# Patient Record
Sex: Female | Born: 1965
Health system: Southern US, Community
[De-identification: ages and names within clinical notes are randomized; demographics above are authoritative.]

## PROBLEM LIST (undated history)

## (undated) DIAGNOSIS — I10 Essential (primary) hypertension: Secondary | ICD-10-CM

## (undated) HISTORY — PX: TUBAL LIGATION: SHX77

---

## 1999-07-11 ENCOUNTER — Other Ambulatory Visit: Admission: RE | Admit: 1999-07-11 | Discharge: 1999-07-11 | Payer: Self-pay | Admitting: Obstetrics and Gynecology

## 2000-01-16 ENCOUNTER — Inpatient Hospital Stay (HOSPITAL_COMMUNITY): Admission: AD | Admit: 2000-01-16 | Discharge: 2000-01-20 | Payer: Self-pay | Admitting: Obstetrics and Gynecology

## 2000-01-16 ENCOUNTER — Encounter (INDEPENDENT_AMBULATORY_CARE_PROVIDER_SITE_OTHER): Payer: Self-pay

## 2000-03-26 ENCOUNTER — Other Ambulatory Visit: Admission: RE | Admit: 2000-03-26 | Discharge: 2000-03-26 | Payer: Self-pay | Admitting: Obstetrics and Gynecology

## 2001-04-20 ENCOUNTER — Other Ambulatory Visit: Admission: RE | Admit: 2001-04-20 | Discharge: 2001-04-20 | Payer: Self-pay | Admitting: Obstetrics and Gynecology

## 2002-05-28 ENCOUNTER — Other Ambulatory Visit: Admission: RE | Admit: 2002-05-28 | Discharge: 2002-05-28 | Payer: Self-pay | Admitting: Obstetrics and Gynecology

## 2003-06-08 ENCOUNTER — Other Ambulatory Visit: Admission: RE | Admit: 2003-06-08 | Discharge: 2003-06-08 | Payer: Self-pay | Admitting: Obstetrics and Gynecology

## 2004-07-06 ENCOUNTER — Other Ambulatory Visit: Admission: RE | Admit: 2004-07-06 | Discharge: 2004-07-06 | Payer: Self-pay | Admitting: Obstetrics and Gynecology

## 2005-07-29 ENCOUNTER — Other Ambulatory Visit: Admission: RE | Admit: 2005-07-29 | Discharge: 2005-07-29 | Payer: Self-pay | Admitting: Obstetrics and Gynecology

## 2007-03-06 ENCOUNTER — Ambulatory Visit (HOSPITAL_COMMUNITY): Admission: RE | Admit: 2007-03-06 | Discharge: 2007-03-06 | Payer: Self-pay | Admitting: Obstetrics and Gynecology

## 2011-02-01 NOTE — Discharge Summary (Signed)
[facility]  Patient:    Denise Trujillo, Denise Trujillo                    MRN: [id number] Adm. Date:  [id number] Disc. Date: [id number] Attending:  Cordelia Pen Ii Dictator:   Danie Chandler, R.N.                           Discharge Summary  ADMISSION DIAGNOSES:          1. Intrauterine pregnancy at 39-3/[redacted] weeks gestation.                               2. Previous cesarean section desires repeat.                               3. Spontaneous rupture of membranes.  DISCHARGE DIAGNOSES:          1. Intrauterine pregnancy at 39-3/[redacted] weeks gestation.                               2. Previous cesarean section desires repeat.                               3. Spontaneous rupture of membranes.  PROCEDURE:                    On Jan 16, 2000, repeat low transverse cesarean section.  REASON FOR ADMISSION:         The patient is a 45 year old black female, gravida 2, para 1, with a previous cesarean section who desires repeat.  The patient had spontaneous rupture of membranes on the morning of Jan 16, 2000, and the examination in the office was consistent with gross rupture of membranes.  HOSPITAL COURSE:              The patient was taken to the operating room and underwent the above named procedure without complications.  This was productive of a viable female infant with Apgars of 9 at one minute and 9 at five minutes and an arterial cord pH of 7.28.  Postoperatively, on day #1, the patients hemoglobin as 11.1,  hematocrit 32.4, white blood cell count 13.9, and platelets 173,000.  On  postoperative day #2, the patient was without complaint.  She had good control f pain and was tolerating a regular diet.  She also had good return of bowel function and was ambulating well without difficulty.  On postoperative day #3, the patient had a slight temperature increase to 99.7 and her fundus was slightly tender. er incision was clean, dry, and intact.  She was  watched on this day to monitor her temperature and she was discharged home on postoperative day #4.  Vital signs were stable and afebrile.  Inicsion was clean, dry, and intact with no evidence of infection.  CONDITION ON DISCHARGE:       Good.  Diet regular as tolerated.  Activity; no heavy lifting, no driving, and no vaginal entry.  FOLLOW-UP:                    She is to follow up in the office in two weeks for an incision check  and she is to call for a temperature greater than 100 degrees, persistent nausea and vomiting, heavy vaginal bleeding, and/or redness or draiange from the incision site.  DISCHARGE MEDICATIONS:        1. Prenatal vitamins one p.o. q.d.                               2. Tylox #30 one to two p.o. q.3-4h. p.r.n. pain.                               3. Advil 600 mg p.o. q.6h. p.r.n. pain. DD:  02/01/00 TD:  02/03/00 Job: [id number]

## 2011-02-01 NOTE — Op Note (Signed)
[facility]  Patient:    Denise Trujillo, Denise Trujillo                    MRN: [id number] Proc. Date: 01/16/00 Adm. Date:  [id number] Attending:  Cordelia Pen Ii                           Operative Report  PREOPERATIVE DIAGNOSIS:       Intrauterine pregnancy at 39-3/7 weeks.  Previous  cesarean section desires repeat.  Spontaneous rupture of membranes.  POSTOPERATIVE DIAGNOSIS:      Intrauterine pregnancy at 39-3/7 weeks.  Previous  cesarean section desires repeat.  Spontaneous rupture of membranes.  OPERATION:                    Repeat low transverse cesarean section.  SURGEON:                      Guy Sandifer. Arleta Creek, M.D.  ASSISTANT:  ANESTHESIA:                   Spinal by Raul Del, M.D.  ESTIMATED BLOOD LOSS:         600 cc.  FINDINGS:                     A viable female infant with Apgars of 9 and 9 at ne and five minutes, respectively.  Birth weight pending.  Arterial cord pH 7.28.  INDICATIONS:                  This patient is a 45 year old black female, gravida 2, para 1, with previous cesarean section who desires repeat.  She had spontaneous rupture of membranes this morning at approximately 5:30 a.m.  Examination in the office was consistent with gross rupture of membranes.  Details are dictated in the history and physical.  Repeat cesarean section is discussed.  Possible risks and complications are discussed with the patient including, but not limited to, infection, bowel, bladder, or ureteral damage, bleeding requiring transfusion of blood products with possible HIV, hepatitis transmission, DVT, PE, and pneumonia. The patient is started on the Group B Strep protocol per IV while awaiting cesarean section.  DESCRIPTION OF PROCEDURE:     The patient is taken to the operating room where  spinal anesthetic is placed.  She is then placed in the dorsal supine position ith a 15 degree left lateral wedge where she is  prepped abdominally and the Foley catheter is placed in the bladder and she is draped in a sterile fashion. After testing for adequate spinal anesthesia, the skin is entered through the previous Pfannenstiel incision and dissection is carried out in layers to the peritoneum. The peritoneum is sharply incised and extended superiorly and inferiorly.  The vesicouterine peritoneum is taken down cephalolaterally, the bladder flap is developed and the bladder blade is placed.  The uterus is incised in a low transverse manner and the uterine cavity is entered bluntly with a Kelly clamp.  The uterine incision is extended cephalolaterally.  Again clear fluid is noted.  The vertex is delivered.  Oral and nasopharynx are suctioned and the infant is delivered.  The cord is clamped and cut and the infant is handed to the awaiting pediatrics team.  The placenta is manually delivered and sent to pathology. Internal uterine contour is normal.  The uterus is closed  in a running locking fashion with 0 Monocryl suture.  Good hemostasis is noted.  Tubes and ovaries are normal bilaterally.  Anterior peritoneum is closed in a running fashion with 0 Monocryl suture which is also used to reapproximate the pyramidalis muscle in the midline.  The anterior rectus fascia is closed in a running fashion with 0 PDS suture and the skin is closed with clips.  All sponge, needle, and instrument counts were correct and the patient is transferred to the recovery room in stable condition. DD:  01/16/00 TD:  01/17/00 Job: [id number]

## 2012-06-13 ENCOUNTER — Encounter (HOSPITAL_COMMUNITY): Payer: Self-pay | Admitting: Pharmacist

## 2012-06-18 ENCOUNTER — Other Ambulatory Visit (HOSPITAL_COMMUNITY): Payer: Self-pay

## 2012-06-18 ENCOUNTER — Encounter (HOSPITAL_COMMUNITY)
Admission: RE | Admit: 2012-06-18 | Discharge: 2012-06-18 | Disposition: A | Discharge status: Home / Self Care | Payer: 59 | Source: Ambulatory Visit | Attending: Obstetrics and Gynecology | Admitting: Obstetrics and Gynecology

## 2012-06-18 ENCOUNTER — Encounter (HOSPITAL_COMMUNITY): Payer: Self-pay

## 2012-06-18 HISTORY — DX: Essential (primary) hypertension: I10

## 2012-06-18 LAB — CBC
Hemoglobin: 11.2 g/dL — ABNORMAL LOW (ref 12.0–15.0)
MCV: 85.2 fL (ref 78.0–100.0)
Platelets: 256 10*3/uL (ref 150–400)
RBC: 4.06 MIL/uL (ref 3.87–5.11)
WBC: 7.2 10*3/uL (ref 4.0–10.5)

## 2012-06-18 LAB — BASIC METABOLIC PANEL
CO2: 31 mEq/L (ref 19–32)
Chloride: 100 mEq/L (ref 96–112)
Creatinine, Ser: 0.61 mg/dL (ref 0.50–1.10)
Glucose, Bld: 94 mg/dL (ref 70–99)

## 2012-06-18 NOTE — Patient Instructions (Addendum)
20 Denise Trujillo  06/18/2012   Your procedure is scheduled on:  06/24/12  Enter through the Main Entrance of [facility] at 7 AM.  Pick up the phone at the desk and dial [phone].   Call this number if you have problems the morning of surgery: [phone]   Remember:   Do not eat food:After Midnight.  Do not drink clear liquids: After Midnight.  Take these medicines the morning of surgery with A SIP OF WATER: Blood pressure medication   Do not wear jewelry, make-up or nail polish.  Do not wear lotions, powders, or perfumes. You may wear deodorant.  Do not shave 48 hours prior to surgery.  Do not bring valuables to the hospital.  Contacts, dentures or bridgework may not be worn into surgery.  Leave suitcase in the car. After surgery it may be brought to your room.  For patients admitted to the hospital, checkout time is 11:00 AM the day of discharge.   Patients discharged the day of surgery will not be allowed to drive home.  Name and phone number of your driver: husband   [name]  Special Instructions: Shower using CHG 2 nights before surgery and the night before surgery.  If you shower the day of surgery use CHG.  Use special wash - you have one bottle of CHG for all showers.  You should use approximately 1/3 of the bottle for each shower.   Please read over the following fact sheets that you were given: Surgical Site Infection Prevention

## 2012-06-22 NOTE — Progress Notes (Signed)
Denise Trujillo  DICTATION # [id number] CSN# [id number]   Meriel Pica, MD 06/22/2012 6:57 AM

## 2012-06-22 NOTE — H&P (Signed)
Denise Trujillo  DICTATION #  CSN# [id number]   Meriel Pica, MD 06/22/2012 6:55 AM

## 2012-06-23 MED ORDER — LACTATED RINGERS IV SOLN: INTRAVENOUS | Status: DC

## 2012-06-23 NOTE — H&P (Signed)
NAMEQUINTELLA, MURA NO.:  [id number]  MEDICAL RECORD NO.:  [id number]  LOCATION:                                 FACILITY:  PHYSICIAN:  Duke Salvia. Marcelle Overlie, M.D.DATE OF BIRTH:  28-Apr-1966  DATE OF ADMISSION:  06/24/2012 DATE OF DISCHARGE:                             HISTORY & PHYSICAL   CHIEF COMPLAINT:  Menorrhagia.  HISTORY OF PRESENT ILLNESS:  A 46 year old, G2, P2.  She has had prior Essure tubal, and also had cryo of her cervix for mild dysplasia May 2001.  Her last Pap, March 2013, was normal.  Her children are 6 and 12, two prior cesarean sections.  Due to continued problems with menorrhagia, she had attempted __________ in our office on June 01, 2012, but due to the retroflexed uterus and stenosis, we could not pass the catheter and visualize the cavity.  The ultrasound otherwise showed a retroverted uterus.  She had multiple fibroids, 3.6 x 3.6 x 1.1, 1.9, 1.6 and several smaller, some with areas of degeneration appeared to be abutting the endometrial cavity.  She presents now for hysteroscopy with possible resection of submucous fibroid.  Procedure including risks related to bleeding, infection, other complications may require additional surgery discussed with her which she understands and accepts.  PAST MEDICAL HISTORY:  ALLERGIES:  None.  CURRENT MEDICATIONS:  Antihypertensive, losartan.  FAMILY HISTORY:  Unremarkable.  PHYSICAL EXAMINATION:  VITAL SIGNS:  Temperature 98.2, blood pressure 130/84. HEENT:  Unremarkable. NECK:  Supple without masses. LUNGS:  Clear. CARDIOVASCULAR:  Regular rate and rhythm without murmurs, rubs, gallops. BREASTS:  Without masses. ABDOMEN:  Soft, flat, nontender. GU:  Vulva, vagina, cervix, normal.  Uterus is retroflexed, upper limit of normal size.  Adnexa negative. EXTREMITIES:  Unremarkable. NEUROLOGIC:  Unremarkable.  IMPRESSION:  Menorrhagia, leiomyoma.  PLAN:  Hysteroscopy with possible  TRUCLEAR resection of submucous fibroid.  The procedure and risks discussed as above.     Nova Evett M. Marcelle Overlie, M.D.     [facility]/MED[facility]  D:  06/21/2012  T:  06/22/2012  Job:  [id number]

## 2012-06-24 ENCOUNTER — Ambulatory Visit (HOSPITAL_COMMUNITY)
Admission: RE | Admit: 2012-06-24 | Discharge: 2012-06-24 | Disposition: A | Discharge status: Home / Self Care | Payer: 59 | Source: Ambulatory Visit | Attending: Obstetrics and Gynecology | Admitting: Obstetrics and Gynecology

## 2012-06-24 ENCOUNTER — Encounter (HOSPITAL_COMMUNITY)
Admission: RE | Disposition: A | Discharge status: Home / Self Care | Payer: Self-pay | Source: Ambulatory Visit | Attending: Obstetrics and Gynecology

## 2012-06-24 ENCOUNTER — Ambulatory Visit (HOSPITAL_COMMUNITY): Payer: 59 | Admitting: Anesthesiology

## 2012-06-24 ENCOUNTER — Encounter (HOSPITAL_COMMUNITY): Payer: Self-pay | Admitting: Anesthesiology

## 2012-06-24 DIAGNOSIS — D250 Submucous leiomyoma of uterus: Secondary | ICD-10-CM | POA: Insufficient documentation

## 2012-06-24 DIAGNOSIS — N920 Excessive and frequent menstruation with regular cycle: Secondary | ICD-10-CM | POA: Insufficient documentation

## 2012-06-24 DIAGNOSIS — N840 Polyp of corpus uteri: Secondary | ICD-10-CM | POA: Insufficient documentation

## 2012-06-24 HISTORY — PX: HYSTEROSCOPY WITH D & C: SHX1775

## 2012-06-24 LAB — PREGNANCY, URINE: Preg Test, Ur: NEGATIVE

## 2012-06-24 SURGERY — DILATATION AND CURETTAGE /HYSTEROSCOPY
Anesthesia: General | Site: Uterus | Wound class: Clean Contaminated

## 2012-06-24 MED ORDER — DEXAMETHASONE SODIUM PHOSPHATE 10 MG/ML IJ SOLN
INTRAMUSCULAR | Status: AC
  Filled 2012-06-24: qty 1

## 2012-06-24 MED ORDER — MIDAZOLAM HCL 5 MG/5ML IJ SOLN
INTRAMUSCULAR | Status: DC | PRN
  Administered 2012-06-24: 2 mg via INTRAVENOUS

## 2012-06-24 MED ORDER — DEXTROSE IN LACTATED RINGERS 5 % IV SOLN: INTRAVENOUS | Status: DC

## 2012-06-24 MED ORDER — PROPOFOL 10 MG/ML IV EMUL
INTRAVENOUS | Status: DC | PRN
  Administered 2012-06-24: 150 mg via INTRAVENOUS

## 2012-06-24 MED ORDER — KETOROLAC TROMETHAMINE 30 MG/ML IJ SOLN
15.0000 mg | Freq: Once | INTRAMUSCULAR | Status: AC | PRN
  Administered 2012-06-24: 30 mg via INTRAVENOUS

## 2012-06-24 MED ORDER — CEFAZOLIN SODIUM-DEXTROSE 2-3 GM-% IV SOLR: 2.0000 g | INTRAVENOUS | Status: DC

## 2012-06-24 MED ORDER — LIDOCAINE HCL 1 % IJ SOLN
INTRAMUSCULAR | Status: DC | PRN
  Administered 2012-06-24: 5 mL

## 2012-06-24 MED ORDER — KETOROLAC TROMETHAMINE 30 MG/ML IJ SOLN
INTRAMUSCULAR | Status: AC
  Administered 2012-06-24: 30 mg via INTRAVENOUS
  Filled 2012-06-24: qty 1

## 2012-06-24 MED ORDER — LIDOCAINE HCL (CARDIAC) 20 MG/ML IV SOLN
INTRAVENOUS | Status: DC | PRN
  Administered 2012-06-24: 50 mg via INTRAVENOUS

## 2012-06-24 MED ORDER — LACTATED RINGERS IV SOLN
INTRAVENOUS | Status: DC
  Administered 2012-06-24: 08:00:00 via INTRAVENOUS
  Administered 2012-06-24: 50 mL/h via INTRAVENOUS

## 2012-06-24 MED ORDER — FENTANYL CITRATE 0.05 MG/ML IJ SOLN
INTRAMUSCULAR | Status: DC | PRN
  Administered 2012-06-24: 50 ug via INTRAVENOUS

## 2012-06-24 MED ORDER — ACETAMINOPHEN 10 MG/ML IV SOLN
INTRAVENOUS | Status: AC
  Administered 2012-06-24: 1000 mg via INTRAVENOUS
  Filled 2012-06-24: qty 100

## 2012-06-24 MED ORDER — HYDROCODONE-IBUPROFEN 7.5-200 MG PO TABS
1.0000 | ORAL_TABLET | Freq: Three times a day (TID) | ORAL | Status: DC | PRN
Start: 2012-06-24 — End: 2019-10-07

## 2012-06-24 MED ORDER — MIDAZOLAM HCL 2 MG/2ML IJ SOLN
INTRAMUSCULAR | Status: AC
  Filled 2012-06-24: qty 2

## 2012-06-24 MED ORDER — PROPOFOL 10 MG/ML IV EMUL
INTRAVENOUS | Status: AC
  Filled 2012-06-24: qty 20

## 2012-06-24 MED ORDER — DEXAMETHASONE SODIUM PHOSPHATE 4 MG/ML IJ SOLN
INTRAMUSCULAR | Status: DC | PRN
  Administered 2012-06-24: 10 mg via INTRAVENOUS

## 2012-06-24 MED ORDER — SODIUM CHLORIDE 0.9 % IR SOLN
Status: DC | PRN
  Administered 2012-06-24: 1

## 2012-06-24 MED ORDER — ONDANSETRON HCL 4 MG/2ML IJ SOLN
INTRAMUSCULAR | Status: DC | PRN
  Administered 2012-06-24: 4 mg via INTRAVENOUS

## 2012-06-24 MED ORDER — FENTANYL CITRATE 0.05 MG/ML IJ SOLN: 25.0000 ug | INTRAMUSCULAR | Status: DC | PRN

## 2012-06-24 MED ORDER — ONDANSETRON HCL 4 MG/2ML IJ SOLN
INTRAMUSCULAR | Status: AC
  Filled 2012-06-24: qty 2

## 2012-06-24 MED ORDER — FENTANYL CITRATE 0.05 MG/ML IJ SOLN
INTRAMUSCULAR | Status: AC
  Filled 2012-06-24: qty 2

## 2012-06-24 MED ORDER — CEFAZOLIN SODIUM-DEXTROSE 2-3 GM-% IV SOLR
INTRAVENOUS | Status: AC
  Filled 2012-06-24: qty 50

## 2012-06-24 MED ORDER — LIDOCAINE HCL (CARDIAC) 20 MG/ML IV SOLN
INTRAVENOUS | Status: AC
  Filled 2012-06-24: qty 5

## 2012-06-24 MED ORDER — ACETAMINOPHEN 10 MG/ML IV SOLN
1000.0000 mg | Freq: Once | INTRAVENOUS | Status: DC
  Filled 2012-06-24: qty 100

## 2012-06-24 SURGICAL SUPPLY — 20 items
CANISTER SUCTION 2500CC (MISCELLANEOUS) ×2 IMPLANT
CATH ROBINSON RED A/P 16FR (CATHETERS) ×2 IMPLANT
CLOTH BEACON ORANGE TIMEOUT ST (SAFETY) ×2 IMPLANT
CONTAINER PREFILL 10% NBF 60ML (FORM) ×4 IMPLANT
DRAPE HYSTEROSCOPY (DRAPE) ×1 IMPLANT
DRESSING TELFA 8X3 (GAUZE/BANDAGES/DRESSINGS) ×2 IMPLANT
ELECT REM PT RETURN 9FT ADLT (ELECTROSURGICAL)
ELECTRODE REM PT RTRN 9FT ADLT (ELECTROSURGICAL) IMPLANT
GLOVE BIO SURGEON STRL SZ7 (GLOVE) ×4 IMPLANT
GOWN STRL REIN XL XLG (GOWN DISPOSABLE) ×6 IMPLANT
LOOP ANGLED CUTTING 22FR (CUTTING LOOP) IMPLANT
NDL SPNL 22GX3.5 QUINCKE BK (NEEDLE) IMPLANT
NEEDLE SPNL 22GX3.5 QUINCKE BK (NEEDLE) ×2 IMPLANT
PACK VAGINAL MINOR WOMEN LF (CUSTOM PROCEDURE TRAY) ×1 IMPLANT
PAD OB MATERNITY 4.3X12.25 (PERSONAL CARE ITEMS) ×2 IMPLANT
SET TUBE HYSTEROSCOPIC INFLOW (TUBING) ×1 IMPLANT
SET TUBE OUTFLOW HYSTEROSCOPIC (TUBING) ×1 IMPLANT
SYR CONTROL 10ML LL (SYRINGE) ×1 IMPLANT
TOWEL OR 17X24 6PK STRL BLUE (TOWEL DISPOSABLE) ×4 IMPLANT
WATER STERILE IRR 1000ML POUR (IV SOLUTION) ×2 IMPLANT

## 2012-06-24 NOTE — Transfer of Care (Signed)
Immediate Anesthesia Transfer of Care Note  Patient: Denise Trujillo  Procedure(s) Performed: Procedure(s) (LRB) with comments: DILATATION AND CURETTAGE /HYSTEROSCOPY (N/A) - WITH TRUCLEAR  Patient Location: PACU  Anesthesia Type: General  Level of Consciousness: awake and oriented  Airway & Oxygen Therapy: Patient Spontanous Breathing and Patient connected to nasal cannula oxygen  Post-op Assessment: Report given to PACU RN and Post -op Vital signs reviewed and stable  Post vital signs: Reviewed and stable  Complications: No apparent anesthesia complications

## 2012-06-24 NOTE — Anesthesia Preprocedure Evaluation (Addendum)
Anesthesia Evaluation  Patient identified by MRN, date of birth, ID band Patient awake    Reviewed: Allergy & Precautions, H&P , NPO status , Patient's Chart, lab work & pertinent test results, reviewed documented beta blocker date and time   History of Anesthesia Complications Negative for: history of anesthetic complications  Airway Mallampati: II TM Distance: >3 FB Neck ROM: full    Dental  (+) Teeth Intact   Pulmonary neg pulmonary ROS,  breath sounds clear to auscultation  Pulmonary exam normal       Cardiovascular hypertension, On Medications Rhythm:regular Rate:Normal     Neuro/Psych negative neurological ROS  negative psych ROS   GI/Hepatic negative GI ROS, Neg liver ROS,   Endo/Other  negative endocrine ROS  Renal/GU negative Renal ROS  Female GU complaint     Musculoskeletal   Abdominal   Peds  Hematology negative hematology ROS (+)   Anesthesia Other Findings   Reproductive/Obstetrics negative OB ROS                          Anesthesia Physical Anesthesia Plan  ASA: II  Anesthesia Plan: General LMA   Post-op Pain Management:    Induction:   Airway Management Planned:   Additional Equipment:   Intra-op Plan:   Post-operative Plan:   Informed Consent: I have reviewed the patients History and Physical, chart, labs and discussed the procedure including the risks, benefits and alternatives for the proposed anesthesia with the patient or authorized representative who has indicated his/her understanding and acceptance.   Dental Advisory Given  Plan Discussed with: CRNA and Surgeon  Anesthesia Plan Comments:        Anesthesia Quick Evaluation

## 2012-06-24 NOTE — Anesthesia Postprocedure Evaluation (Signed)
Anesthesia Post Note  Patient: Denise Trujillo  Procedure(s) Performed: Procedure(s) (LRB): DILATATION AND CURETTAGE /HYSTEROSCOPY (N/A)  Anesthesia type: General  Patient location: PACU  Post pain: Pain level controlled  Post assessment: Post-op Vital signs reviewed  Last Vitals:  Filed Vitals:   06/24/12 1000  BP: 121/80  Pulse: 67  Temp: 37 C  Resp: 16    Post vital signs: Reviewed  Level of consciousness: sedated  Complications: No apparent anesthesia complicationsfj

## 2012-06-24 NOTE — Progress Notes (Signed)
The patient was re-examined with no change in status 

## 2012-06-24 NOTE — Anesthesia Procedure Notes (Signed)
Procedure Name: LMA Insertion Date/Time: 06/24/2012 8:34 AM Performed by: Isabella Bowens R Pre-anesthesia Checklist: Patient identified, Emergency Drugs available, Suction available, Patient being monitored and Timeout performed Patient Re-evaluated:Patient Re-evaluated prior to inductionOxygen Delivery Method: Circle system utilized Preoxygenation: Pre-oxygenation with 100% oxygen Intubation Type: IV induction Ventilation: Mask ventilation without difficulty LMA: LMA inserted LMA Size: 4.0 Grade View: Grade II Number of attempts: 1 Placement Confirmation: positive ETCO2 and breath sounds checked- equal and bilateral Dental Injury: Teeth and Oropharynx as per pre-operative assessment

## 2012-06-24 NOTE — Op Note (Signed)
Preoperative diagnosis: Menorrhagia, leiomyoma  Postoperative diagnosis: Same  Procedure: Hysteroscopy, D&C  Surgeon: Marcelle Overlie  Anesthesia: Gen.  Specimens removed: Endometrial curettings, to pathology  Complications: None  EBL: 10 cc  Procedure and findings:  The patient taken the operating room after an adequate level of general anesthesia was obtained appropriate timeout for taken the patient was prepped and draped in usual fashion for D&C the bladder was drained.  EUA carried out the uterus was 9 weeks size, irregular with leiomyoma noted adnexa negative, the uterus was mobile. Speculum was positioned cervix grasped with tenaculum, paracervical block was then created by infiltrating at 3 and 9:00 submucosally, 5-7 cc of 1% Xylocaine at each site after negative aspiration. The uterus is then sounded to 10 cm progressively dilated to a 27 Pratt dilator, continuous flow hysteroscope was inserted revealing a lot of shaggy endometrium and a small endometrial polyp. Expiration the cavity followed by gentle sharp curettage revealed a moderate amount of tissue which was sent to pathology once this was completed the scope was inserted for better visualization both Essure coil to be seen at each tubal ostia from her previous tubal procedure but there were no other significant abnormalities in the cavity. No remaining polyps nor evidence of submucous type fibroid. Procedure was completed at that point minimal bleeding she tolerated this well went to recovery in good condition.  Dictated with [facility]  Denise Trujillo M. Milana Obey.D.

## 2012-06-25 ENCOUNTER — Encounter (HOSPITAL_COMMUNITY): Payer: Self-pay | Admitting: Obstetrics and Gynecology

## 2014-06-24 ENCOUNTER — Other Ambulatory Visit: Payer: Self-pay | Admitting: Obstetrics and Gynecology

## 2014-06-27 LAB — CYTOLOGY - PAP

## 2014-11-03 ENCOUNTER — Other Ambulatory Visit: Payer: Self-pay | Admitting: Oral Surgery

## 2014-11-03 DIAGNOSIS — S0300XS Dislocation of jaw, unspecified side, sequela: Secondary | ICD-10-CM

## 2014-11-18 ENCOUNTER — Other Ambulatory Visit: Payer: Self-pay

## 2014-11-21 ENCOUNTER — Ambulatory Visit
Admission: RE | Admit: 2014-11-21 | Discharge: 2014-11-21 | Disposition: A | Discharge status: Home / Self Care | Payer: 59 | Source: Ambulatory Visit | Attending: Oral Surgery | Admitting: Oral Surgery

## 2014-11-21 DIAGNOSIS — S0300XS Dislocation of jaw, unspecified side, sequela: Secondary | ICD-10-CM

## 2015-07-20 ENCOUNTER — Other Ambulatory Visit: Payer: Self-pay | Admitting: Obstetrics and Gynecology

## 2015-07-20 DIAGNOSIS — R928 Other abnormal and inconclusive findings on diagnostic imaging of breast: Secondary | ICD-10-CM

## 2015-07-26 ENCOUNTER — Ambulatory Visit
Admission: RE | Admit: 2015-07-26 | Discharge: 2015-07-26 | Disposition: A | Discharge status: Home / Self Care | Payer: Commercial Managed Care - HMO | Source: Ambulatory Visit | Attending: Obstetrics and Gynecology | Admitting: Obstetrics and Gynecology

## 2015-07-26 DIAGNOSIS — R928 Other abnormal and inconclusive findings on diagnostic imaging of breast: Secondary | ICD-10-CM

## 2016-12-10 IMAGING — MG MM DIAG BREAST TOMO UNI RIGHT
6 series · 6 of 18 positions shown · non-contrast
Comparison: Previous exam(s).

CLINICAL DATA: Patient was called back from screening mammogram for
possible abnormality in the right breast.

EXAM:
DIGITAL DIAGNOSTIC RIGHT MAMMOGRAM WITH 3D TOMOSYNTHESIS AND CAD

[R CC]
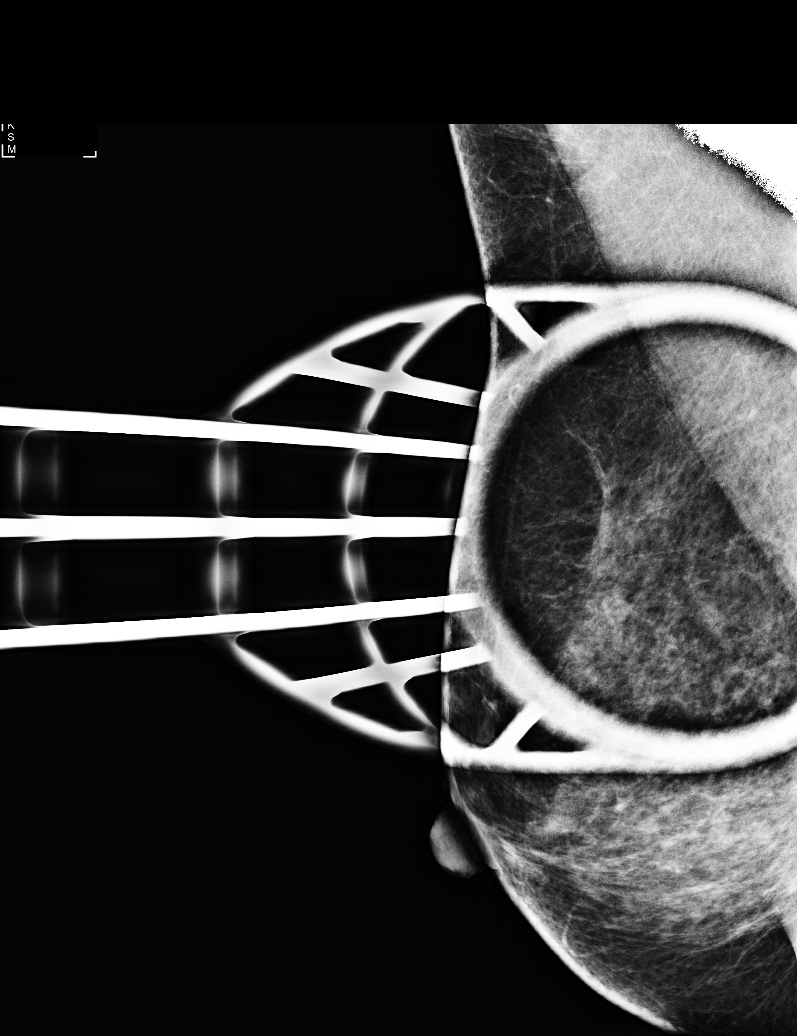

[R ML]
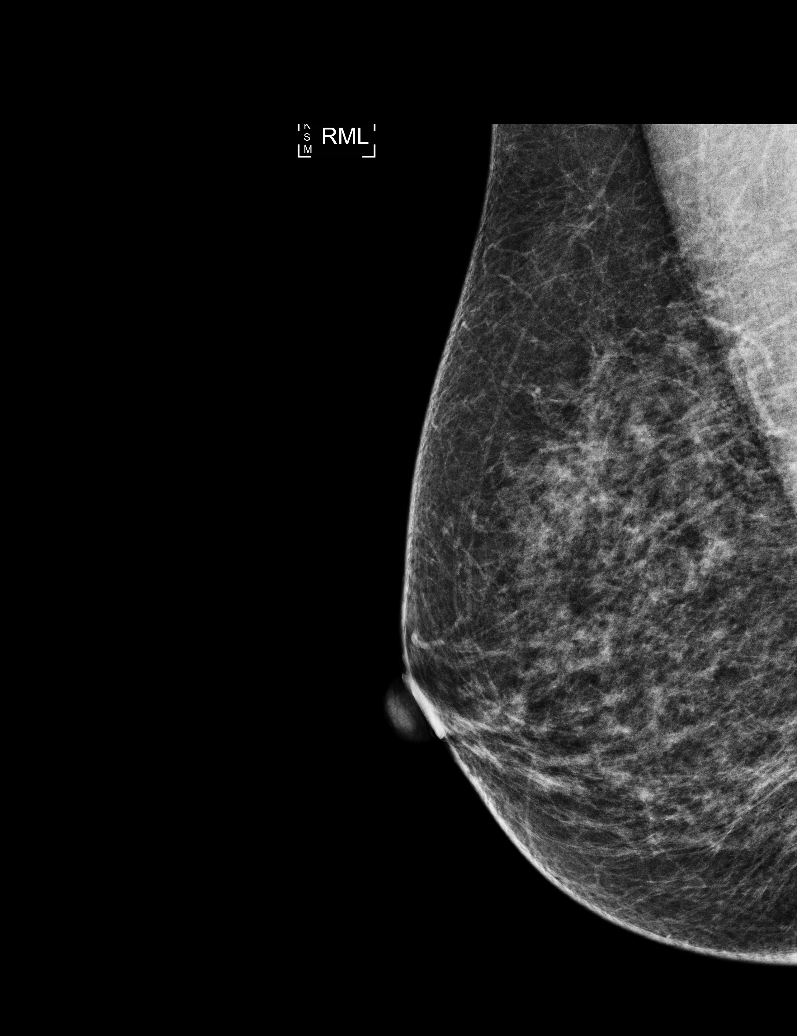

[R MLO]
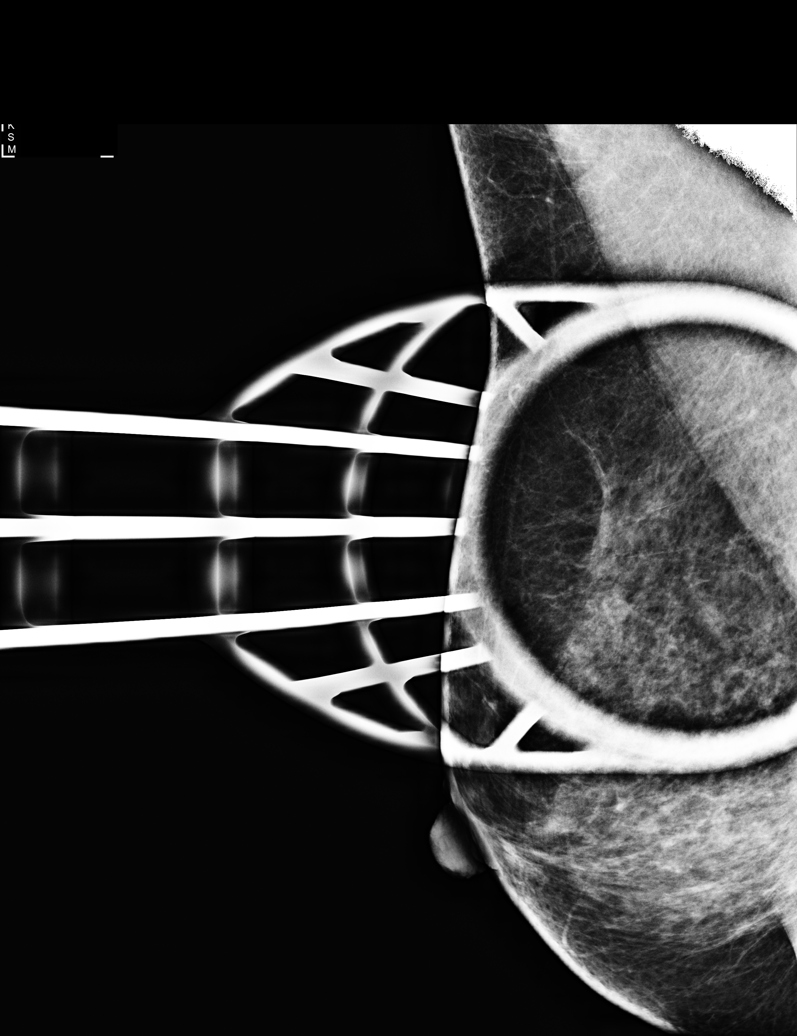

[R CC tomo · tomo slice 31/60.0]
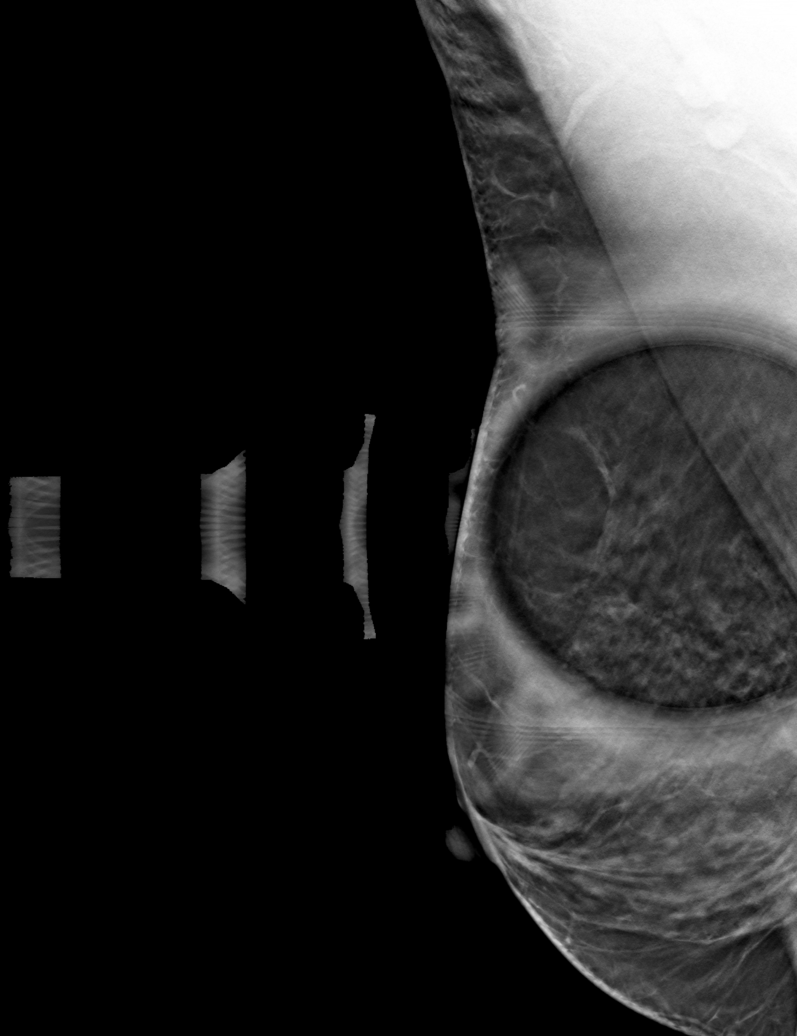

[R ML tomo · tomo slice 27/54.0]
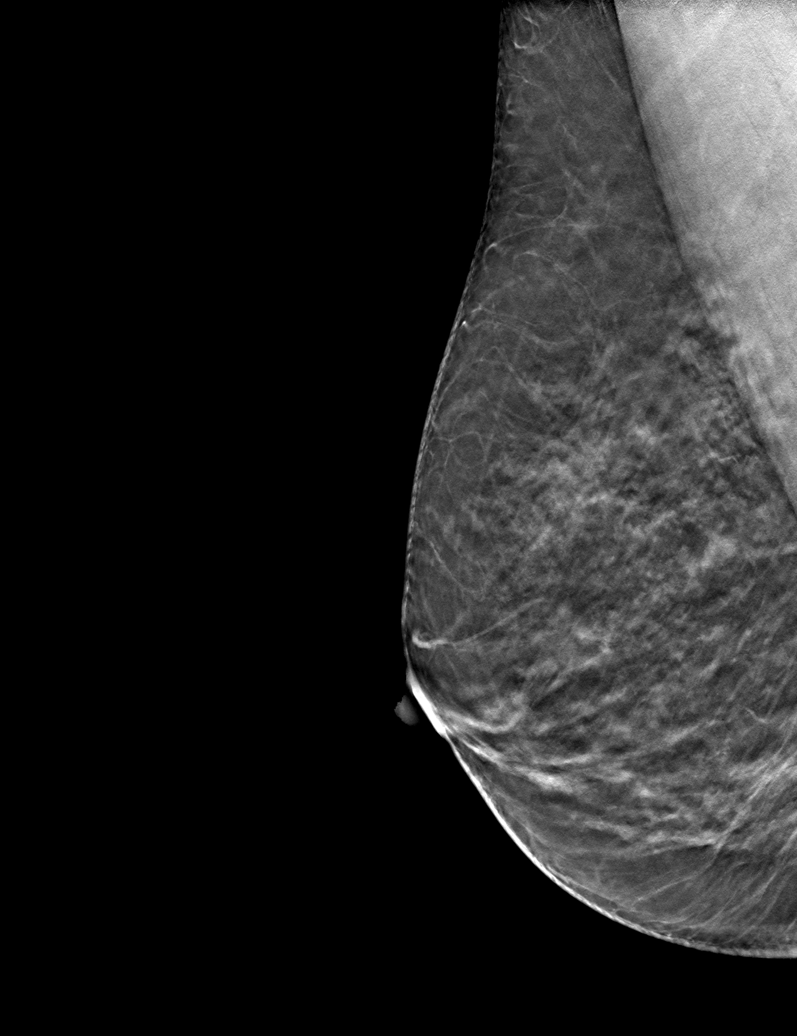

[R MLO tomo · tomo slice 31/60.0]
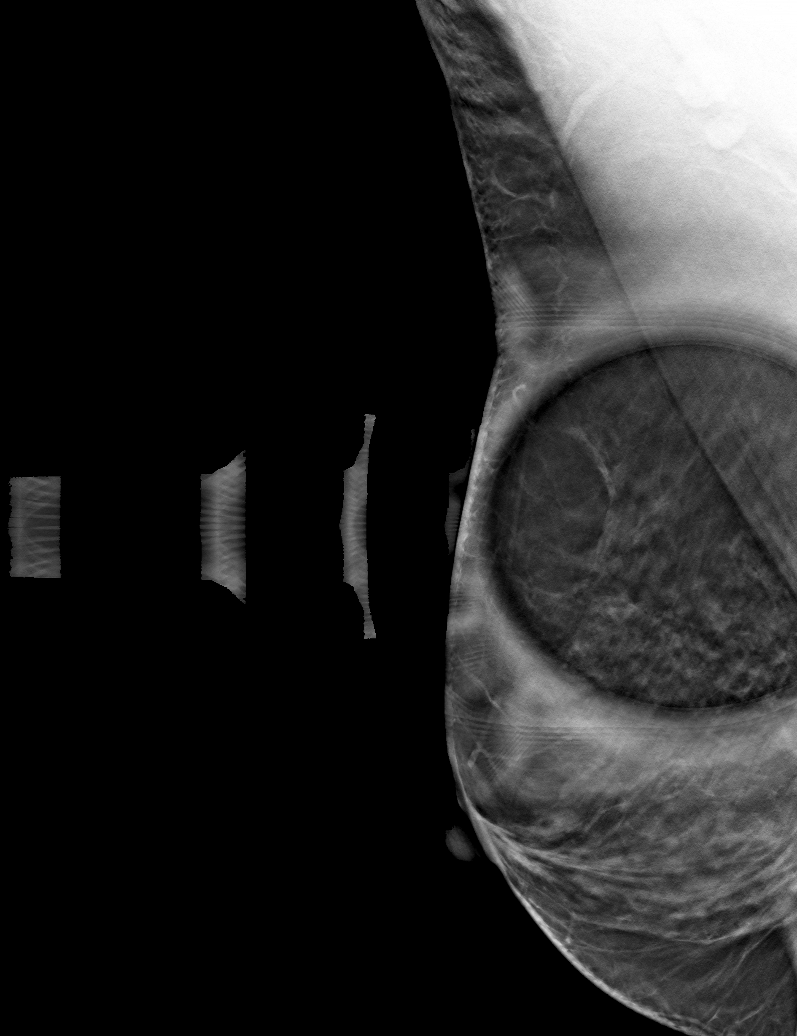

[6 of 18 positions shown; findings below may reference images not displayed]

ACR Breast Density Category c: The breast tissue is heterogeneously
dense, which may obscure small masses.
FINDINGS: Additional imaging of the right breast was performed. No suspicious
mass, malignant type microcalcifications or distortion detected.

Mammographic images were processed with CAD.
IMPRESSION: No evidence of malignancy in the right breast.

RECOMMENDATION:
Bilateral screening mammogram in 1 year is recommended.

I have discussed the findings and recommendations with the patient.
Results were also provided in writing at the conclusion of the
visit. If applicable, a reminder letter will be sent to the patient
regarding the next appointment.

BI-RADS CATEGORY  1: Negative.

## 2017-02-26 DIAGNOSIS — Z13220 Encounter for screening for lipoid disorders: Secondary | ICD-10-CM | POA: Diagnosis not present

## 2017-02-26 DIAGNOSIS — D649 Anemia, unspecified: Secondary | ICD-10-CM | POA: Diagnosis not present

## 2017-02-26 DIAGNOSIS — Z0000 Encounter for general adult medical examination without abnormal findings: Secondary | ICD-10-CM | POA: Diagnosis not present

## 2017-09-11 DIAGNOSIS — M76811 Anterior tibial syndrome, right leg: Secondary | ICD-10-CM | POA: Diagnosis not present

## 2017-09-11 DIAGNOSIS — M659 Synovitis and tenosynovitis, unspecified: Secondary | ICD-10-CM | POA: Diagnosis not present

## 2017-09-11 DIAGNOSIS — M76821 Posterior tibial tendinitis, right leg: Secondary | ICD-10-CM | POA: Diagnosis not present

## 2017-09-11 DIAGNOSIS — M71571 Other bursitis, not elsewhere classified, right ankle and foot: Secondary | ICD-10-CM | POA: Diagnosis not present

## 2017-09-15 DIAGNOSIS — Z01419 Encounter for gynecological examination (general) (routine) without abnormal findings: Secondary | ICD-10-CM | POA: Diagnosis not present

## 2017-09-16 DIAGNOSIS — M76821 Posterior tibial tendinitis, right leg: Secondary | ICD-10-CM | POA: Diagnosis not present

## 2017-09-16 DIAGNOSIS — M76811 Anterior tibial syndrome, right leg: Secondary | ICD-10-CM | POA: Diagnosis not present

## 2017-09-23 DIAGNOSIS — M71571 Other bursitis, not elsewhere classified, right ankle and foot: Secondary | ICD-10-CM | POA: Diagnosis not present

## 2017-09-23 DIAGNOSIS — M76811 Anterior tibial syndrome, right leg: Secondary | ICD-10-CM | POA: Diagnosis not present

## 2017-10-06 DIAGNOSIS — M71571 Other bursitis, not elsewhere classified, right ankle and foot: Secondary | ICD-10-CM | POA: Diagnosis not present

## 2017-10-06 DIAGNOSIS — M76821 Posterior tibial tendinitis, right leg: Secondary | ICD-10-CM | POA: Diagnosis not present

## 2017-10-19 DIAGNOSIS — R05 Cough: Secondary | ICD-10-CM | POA: Diagnosis not present

## 2017-10-19 DIAGNOSIS — J029 Acute pharyngitis, unspecified: Secondary | ICD-10-CM | POA: Diagnosis not present

## 2018-03-11 DIAGNOSIS — I10 Essential (primary) hypertension: Secondary | ICD-10-CM | POA: Diagnosis not present

## 2018-03-11 DIAGNOSIS — Z1211 Encounter for screening for malignant neoplasm of colon: Secondary | ICD-10-CM | POA: Diagnosis not present

## 2018-03-11 DIAGNOSIS — Z0000 Encounter for general adult medical examination without abnormal findings: Secondary | ICD-10-CM | POA: Diagnosis not present

## 2018-09-30 DIAGNOSIS — Z6833 Body mass index (BMI) 33.0-33.9, adult: Secondary | ICD-10-CM | POA: Diagnosis not present

## 2018-09-30 DIAGNOSIS — Z01419 Encounter for gynecological examination (general) (routine) without abnormal findings: Secondary | ICD-10-CM | POA: Diagnosis not present

## 2018-09-30 DIAGNOSIS — N939 Abnormal uterine and vaginal bleeding, unspecified: Secondary | ICD-10-CM | POA: Diagnosis not present

## 2018-10-07 DIAGNOSIS — N924 Excessive bleeding in the premenopausal period: Secondary | ICD-10-CM | POA: Diagnosis not present

## 2018-10-07 DIAGNOSIS — N926 Irregular menstruation, unspecified: Secondary | ICD-10-CM | POA: Diagnosis not present

## 2018-10-07 DIAGNOSIS — D251 Intramural leiomyoma of uterus: Secondary | ICD-10-CM | POA: Diagnosis not present

## 2018-10-20 DIAGNOSIS — N939 Abnormal uterine and vaginal bleeding, unspecified: Secondary | ICD-10-CM | POA: Diagnosis not present

## 2018-10-20 DIAGNOSIS — N959 Unspecified menopausal and perimenopausal disorder: Secondary | ICD-10-CM | POA: Diagnosis not present

## 2019-10-06 ENCOUNTER — Telehealth (HOSPITAL_COMMUNITY): Payer: Self-pay

## 2019-10-06 ENCOUNTER — Other Ambulatory Visit: Payer: Self-pay

## 2019-10-06 DIAGNOSIS — M7989 Other specified soft tissue disorders: Secondary | ICD-10-CM

## 2019-10-06 NOTE — Progress Notes (Signed)
[facility]  [place]   Reason for referral: Swollen right leg  History of Present Illness  Denise Trujillo is a 54 y.o. female who presents with chief complaint: swollen leg.  Patient notes, onset of swelling 6-9 months ago, associated with knee pain and prolonged sitting and standing.  The patient has had no history of DVT, no history of varicose vein, no history of venous stasis ulcers, no history of  Lymphedema and positive history of skin changes in lower legs.  There is no family history of venous disorders.  The patient has not used 20-30 mmhg compression stockings in the past.  She denise calf pain and rest pain without history of non healing ulcers.  Past Medical History:  Diagnosis Date  . Hypertension     Past Surgical History:  Procedure Laterality Date  . CESAREAN SECTION     x2  . HYSTEROSCOPY WITH D & C  06/24/2012   Procedure: DILATATION AND CURETTAGE /HYSTEROSCOPY;  Surgeon: Margarette Asal, MD;  Location: [facility];  Service: Gynecology;  Laterality: N/A;  WITH TRUCLEAR  . TUBAL LIGATION      Social History   Socioeconomic History  . Marital status: Married    Spouse name: Not on file  . Number of children: Not on file  . Years of education: Not on file  . Highest education level: Not on file  Occupational History  . Not on file  Tobacco Use  . Smoking status: Never Smoker  . Smokeless tobacco: Never Used  Substance and Sexual Activity  . Alcohol use: Not on file  . Drug use: Not on file  . Sexual activity: Not on file  Other Topics Concern  . Not on file  Social History Narrative  . Not on file   Social Determinants of Health   Financial Resource Strain:   . Difficulty of Paying Living Expenses: Not on file  Food Insecurity:   . Worried About Charity fundraiser in the Last Year: Not on file  . Ran Out of Food in the Last Year: Not on file  Transportation Needs:   . Lack of Transportation (Medical): Not on file  . Lack of  Transportation (Non-Medical): Not on file  Physical Activity:   . Days of Exercise per Week: Not on file  . Minutes of Exercise per Session: Not on file  Stress:   . Feeling of Stress : Not on file  Social Connections:   . Frequency of Communication with Friends and Family: Not on file  . Frequency of Social Gatherings with Friends and Family: Not on file  . Attends Religious Services: Not on file  . Active Member of Clubs or Organizations: Not on file  . Attends Archivist Meetings: Not on file  . Marital Status: Not on file  Intimate Partner Violence:   . Fear of Current or Ex-Partner: Not on file  . Emotionally Abused: Not on file  . Physically Abused: Not on file  . Sexually Abused: Not on file    No family history on file.  Current Outpatient Medications on File Prior to Visit  Medication Sig Dispense Refill  . Ferrous Sulfate (IRON) 325 (65 Fe) MG TABS Take by mouth.    . hydrochlorothiazide (HYDRODIURIL) 25 MG tablet Take 25 mg by mouth daily.    Marland Kitchen losartan-hydrochlorothiazide (HYZAAR) 50-12.5 MG per tablet Take 1 tablet by mouth daily.    Marland Kitchen OVER THE COUNTER MEDICATION Take 5 mLs by mouth  daily with breakfast. Water Divine Iron Supplement    . OVER THE COUNTER MEDICATION Take 2 capsules by mouth daily. Hair Finity     No current facility-administered medications on file prior to visit.    Allergies as of 10/07/2019  . (No Known Allergies)     ROS:   General:  No weight loss, Fever, chills  HEENT: No recent headaches, no nasal bleeding, no visual changes, no sore throat  Neurologic: No dizziness, blackouts, seizures. No recent symptoms of stroke or mini- stroke. No recent episodes of slurred speech, or temporary blindness.  Cardiac: No recent episodes of chest pain/pressure, no shortness of breath at rest.  No shortness of breath with exertion.  Denies history of atrial fibrillation or irregular heartbeat  Vascular: No history of rest pain in feet.  No  history of claudication.  No history of non-healing ulcer, No history of DVT   Pulmonary: No home oxygen, no productive cough, no hemoptysis,  No asthma or wheezing  Musculoskeletal:  [ ]  Arthritis, [ ]  Low back pain,  [x ] Joint pain  Hematologic:No history of hypercoagulable state.  No history of easy bleeding.  No history of anemia  Gastrointestinal: No hematochezia or melena,  No gastroesophageal reflux, no trouble swallowing  Urinary: [ ]  chronic Kidney disease, [ ]  on HD - [ ]  MWF or [ ]  TTHS, [ ]  Burning with urination, [ ]  Frequent urination, [ ]  Difficulty urinating;   Skin: No rashes  Psychological: No history of anxiety,  No history of depression  Physical Examination  Vitals:   10/07/19 1319  BP: 132/76  Pulse: 79  Resp: 16  Temp: 98 F (36.7 C)  TempSrc: Temporal  SpO2: 100%  Weight: 224 lb (101.6 kg)  Height: 5\' 7"  (1.702 m)    Body mass index is 35.08 kg/m.  General:  Alert and oriented, no acute distress HEENT: Normal Neck: No bruit or JVD Pulmonary: Clear to auscultation bilaterally Cardiac: Regular Rate and Rhythm without murmur Abdomen: Soft, non-tender, non-distended, no mass, no scars Skin: No rash Extremity Pulses:  2+ radial, brachial, femoral, dorsalis pedis, posterior tibial pulses bilaterally Musculoskeletal: right LE deformity with moderate edema compared to the left LE  Neurologic: Upper and lower extremity motor 5/5 and symmetric  DATA:   Venous Reflux Times +--------------+------+-----------+------------+-------------+ RIGHT         RefluxReflux TimeDiameter cmsComments                     Yes                                       +--------------+------+-----------+------------+-------------+ CFV            yes   >1 second                           +--------------+------+-----------+------------+-------------+ GSV at SFJ     yes    >500 ms      0.86                   +--------------+------+-----------+------------+-------------+ GSV prox thigh                     0.51                  +--------------+------+-----------+------------+-------------+ GSV mid thigh  0.43    out of fascia +--------------+------+-----------+------------+-------------+ GSV dist thigh yes    >500 ms      0.28    out of fascia +--------------+------+-----------+------------+-------------+ GSV at knee    yes    >500 ms      0.28    out of fascia +--------------+------+-----------+------------+-------------+ GSV prox calf  yes    >500 ms      0.19    out of fascia +--------------+------+-----------+------------+-------------+ SSV prox calf                      0.12                  +--------------+------+-----------+------------+-------------+ SSV mid calf                       0.2                   +--------------+------+-----------+------------+-------------+       Summary: Right: 1. There is no evidence of deep vein thrombosis in the lower extremity. 2. There is no evidence of superficial venous thrombosis. 3. There is deep vein reflux in the common femoral vein. 4. There is superficial reflux in the SFJ and the GSV from the distal thigh to the proximal calf.    Assessment: Right LE Venous reflux  SFJ and the GSV from the distal thigh to the proximal calf.  Patent arterial flow with palpable pedal pulses.  Plan: I have ordered thigh high compression to be worn daily.  She will incorporate daily exercise and elevation when at rest.  She will f/u in 3 month to be considered for laser ablation therapy.    Roxy Horseman PA-C [facility] [place] Office: [phone]  MD in clinic [place]

## 2019-10-06 NOTE — Telephone Encounter (Signed)

## 2019-10-07 ENCOUNTER — Encounter: Payer: Self-pay | Admitting: Physician Assistant

## 2019-10-07 ENCOUNTER — Ambulatory Visit (HOSPITAL_COMMUNITY)
Admission: RE | Admit: 2019-10-07 | Discharge: 2019-10-07 | Disposition: A | Discharge status: Home / Self Care | Payer: 59 | Source: Ambulatory Visit | Attending: Vascular Surgery | Admitting: Vascular Surgery

## 2019-10-07 ENCOUNTER — Other Ambulatory Visit: Payer: Self-pay

## 2019-10-07 ENCOUNTER — Ambulatory Visit (INDEPENDENT_AMBULATORY_CARE_PROVIDER_SITE_OTHER): Payer: 59 | Admitting: Physician Assistant

## 2019-10-07 VITALS — BP 132/76 | HR 79 | Temp 98.0°F | Resp 16 | Ht 67.0 in | Wt 224.0 lb

## 2019-10-07 DIAGNOSIS — I872 Venous insufficiency (chronic) (peripheral): Secondary | ICD-10-CM | POA: Diagnosis not present

## 2019-10-07 DIAGNOSIS — M7989 Other specified soft tissue disorders: Secondary | ICD-10-CM | POA: Diagnosis not present

## 2020-01-05 ENCOUNTER — Ambulatory Visit: Payer: 59 | Admitting: Vascular Surgery

## 2020-01-12 ENCOUNTER — Ambulatory Visit: Payer: 59 | Admitting: Vascular Surgery

## 2020-01-19 ENCOUNTER — Encounter: Payer: Self-pay | Admitting: Vascular Surgery

## 2020-01-19 ENCOUNTER — Other Ambulatory Visit: Payer: Self-pay

## 2020-01-19 ENCOUNTER — Ambulatory Visit (INDEPENDENT_AMBULATORY_CARE_PROVIDER_SITE_OTHER): Payer: 59 | Admitting: Vascular Surgery

## 2020-01-19 VITALS — BP 122/66 | HR 82 | Temp 98.0°F | Resp 18 | Ht 67.0 in | Wt 225.0 lb

## 2020-01-19 DIAGNOSIS — I83811 Varicose veins of right lower extremities with pain: Secondary | ICD-10-CM

## 2020-01-19 NOTE — Progress Notes (Signed)
Patient name: Denise Trujillo MRN: [id number] DOB: [birth year] Sex: female  HPI: Denise Trujillo is a 54 y.o. female, previously seen by our PA October 07, 2019 for symptomatic varicose veins.  Patient was given a prescription for lower extremity compression stockings at that time.  She states that these improved but not completely relieve her symptoms.  She primarily complains of swelling around her right ankle.  She complains of a full area over the lateral aspect of her right knee as well.  Past Medical History:  Diagnosis Date  . Hypertension    Past Surgical History:  Procedure Laterality Date  . CESAREAN SECTION     x2  . HYSTEROSCOPY WITH D & C  06/24/2012   Procedure: DILATATION AND CURETTAGE /HYSTEROSCOPY;  Surgeon: Margarette Asal, MD;  Location: [facility];  Service: Gynecology;  Laterality: N/A;  WITH TRUCLEAR  . TUBAL LIGATION      History reviewed. No pertinent family history.  SOCIAL HISTORY: Social History   Socioeconomic History  . Marital status: Married    Spouse name: Not on file  . Number of children: Not on file  . Years of education: Not on file  . Highest education level: Not on file  Occupational History  . Not on file  Tobacco Use  . Smoking status: Never Smoker  . Smokeless tobacco: Never Used  Substance and Sexual Activity  . Alcohol use: Not on file  . Drug use: Not on file  . Sexual activity: Not on file  Other Topics Concern  . Not on file  Social History Narrative  . Not on file   Social Determinants of Health   Financial Resource Strain:   . Difficulty of Paying Living Expenses:   Food Insecurity:   . Worried About Charity fundraiser in the Last Year:   . Arboriculturist in the Last Year:   Transportation Needs:   . Film/video editor (Medical):   Marland Kitchen Lack of Transportation (Non-Medical):   Physical Activity:   . Days of Exercise per Week:   . Minutes of Exercise per Session:   Stress:   . Feeling of Stress :   Social  Connections:   . Frequency of Communication with Friends and Family:   . Frequency of Social Gatherings with Friends and Family:   . Attends Religious Services:   . Active Member of Clubs or Organizations:   . Attends Archivist Meetings:   Marland Kitchen Marital Status:   Intimate Partner Violence:   . Fear of Current or Ex-Partner:   . Emotionally Abused:   Marland Kitchen Physically Abused:   . Sexually Abused:     No Known Allergies  Current Outpatient Medications  Medication Sig Dispense Refill  . Ferrous Sulfate (IRON) 325 (65 Fe) MG TABS Take by mouth.    . losartan-hydrochlorothiazide (HYZAAR) 50-12.5 MG per tablet Take 1 tablet by mouth daily.    Marland Kitchen OVER THE COUNTER MEDICATION Take 5 mLs by mouth daily with breakfast. Water Divine Iron Supplement    . OVER THE COUNTER MEDICATION Take 2 capsules by mouth daily. Hair Finity    . hydrochlorothiazide (HYDRODIURIL) 25 MG tablet Take 25 mg by mouth daily.     No current facility-administered medications for this visit.    ROS:   General:  No weight loss, Fever, chills  Cardiac: No recent episodes of chest pain/pressure, no shortness of breath at rest.  No shortness of breath with exertion.  Denies history  of atrial fibrillation or irregular heartbeat  Vascular: No history of rest pain in feet.  No history of claudication.  No history of non-healing ulcer, No history of DVT   Skin: No rashes   Physical Examination  Vitals:   01/19/20 1433  BP: 122/66  Pulse: 82  Resp: 18  Temp: 98 F (36.7 C)  TempSrc: Temporal  SpO2: 99%  Weight: 225 lb (102.1 kg)  Height: 5\' 7"  (1.702 m)    Body mass index is 35.24 kg/m.  General:  Alert and oriented, no acute distress HEENT: Normal Neck: No JVD Cardiac: Regular Rate and Rhythm Skin: No rash Extremity Pulses:  2+ radial, brachial, femoral, dorsalis pedis, posterior tibial pulses bilaterally Musculoskeletal: No deformity or edema, there is fullness around the right lateral aspect of her  knee but this appears to me to be more adipose tissue.  No obvious surface varicosities. Neurologic: Upper and lower extremity motor 5/5 and symmetric  DATA:  I reviewed the patient's venous duplex exam of the right leg dated October 07, 2019.  This did show reflux in the right greater saphenous vein.  However the vein diameter is fairly small from the mid thigh down less than 3 mm.  I repeated portions of her exam with the SonoSite at the bedside today.  There is a segment of greater saphenous vein that is dilated to 5 mm but this is only about 2 cm from the saphenofemoral junction.  Other than that the vein was about 3 mm or less.  ASSESSMENT: Patient with superficial venous reflux although the vein is too small to be considered for laser ablation at this point.   PLAN: Patient will continue to try with weight loss to improve her symptoms.  I did discuss with her that 75% of vein symptoms improved with weight loss alone.  She will continue to wear compression stockings.  She will follow-up with [place] in the future if her symptoms become worse over time to consider reevaluation to see if the vein has dilated anymore.   Ruta Hinds, MD [facility] Office: [phone] Pager: [phone]
